# Patient Record
Sex: Male | Born: 1995 | Race: White | Hispanic: No | Marital: Single | Smoking: Former smoker
Health system: Southern US, Community
[De-identification: ages and names within clinical notes are randomized; demographics above are authoritative.]

## PROBLEM LIST (undated history)

## (undated) DIAGNOSIS — F419 Anxiety disorder, unspecified: Secondary | ICD-10-CM

---
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
## 2019-11-15 ENCOUNTER — Other Ambulatory Visit: Payer: Self-pay

## 2019-11-15 ENCOUNTER — Telehealth: Payer: Self-pay | Admitting: Emergency Medicine

## 2019-11-15 ENCOUNTER — Emergency Department (INDEPENDENT_AMBULATORY_CARE_PROVIDER_SITE_OTHER)
Admission: EM | Admit: 2019-11-15 | Discharge: 2019-11-15 | Disposition: A | Discharge status: Home / Self Care | Payer: Self-pay | Source: Home / Self Care

## 2019-11-15 ENCOUNTER — Emergency Department
Admission: EM | Admit: 2019-11-15 | Discharge: 2019-11-15 | Disposition: A | Discharge status: Home / Self Care | Payer: Self-pay | Source: Home / Self Care

## 2019-11-15 DIAGNOSIS — Z87442 Personal history of urinary calculi: Secondary | ICD-10-CM

## 2019-11-15 DIAGNOSIS — N342 Other urethritis: Secondary | ICD-10-CM

## 2019-11-15 DIAGNOSIS — R369 Urethral discharge, unspecified: Secondary | ICD-10-CM

## 2019-11-15 DIAGNOSIS — R300 Dysuria: Secondary | ICD-10-CM

## 2019-11-15 HISTORY — DX: Anxiety disorder, unspecified: F41.9

## 2019-11-15 LAB — POCT URINALYSIS DIP (MANUAL ENTRY)
Bilirubin, UA: NEGATIVE
Glucose, UA: NEGATIVE mg/dL
Ketones, POC UA: NEGATIVE mg/dL
Nitrite, UA: NEGATIVE
Spec Grav, UA: 1.02 (ref 1.010–1.025)
Urobilinogen, UA: 0.2 E.U./dL
pH, UA: 6 (ref 5.0–8.0)

## 2019-11-15 MED ORDER — CEFTRIAXONE SODIUM 500 MG IJ SOLR
500.0000 mg | Freq: Once | INTRAMUSCULAR | Status: AC
  Administered 2019-11-15: 500 mg via INTRAMUSCULAR

## 2019-11-15 MED ORDER — DOXYCYCLINE HYCLATE 100 MG PO CAPS: 100.0000 mg | ORAL_CAPSULE | Freq: Two times a day (BID) | ORAL | 0 refills | Status: AC

## 2019-11-15 NOTE — ED Notes (Signed)
Before leaving the building from first visit, patient returned complaining of scrotal pain. Evaluated by provider, questions asked and answers provided. Patient discharged.

## 2019-11-15 NOTE — Telephone Encounter (Signed)
Pt had mild episode of lightheadedness after rocephin injection, pt states he is feeling well now. No rashes. No oral swelling or trouble breathing. Still denies scrotal pain or swelling. Will f/u with urology or go to hospital if symptoms worsen.

## 2019-11-15 NOTE — ED Provider Notes (Addendum)
[facility]    CSN: [id number] Arrival date & time: 11/15/19  9563      History   Chief Complaint Chief Complaint  Patient presents with  . Dysuria    HPI Thomas Bush is a 24 y.o. male.   HPI  Thomas Bush is a 24 y.o. male presenting to UC with c/o dysuria, penile discharge and "prostate pain" that started this morning. Recent hx of multiple kidney stones on Right side, seen in ED for the stones 2 weeks ago for abdominal pain and hematuria.  Largest was 7mm.  He was prescribed vicodin and flomax. He has f/u virtually with urology and had been doing well until this morning. He has had unprotected intercourse but no known exposure to an STI. Denies fever, n/v/d. Denies scrotal pain or swelling. Denies rashes.  Pt triage note from another facility, pt c/o constant scrotal pain and swelling. Pt denies at this time after taking half his Vicodin (5mg /325mg ) tab PTA.  Past Medical History:  Diagnosis Date  . Anxiety     There are no problems to display for this patient.   History reviewed. No pertinent surgical history.     Home Medications    Prior to Admission medications   Medication Sig Start Date End Date Taking? Authorizing Provider  tamsulosin (FLOMAX) 0.4 MG CAPS capsule Take by mouth. 11/08/19  Yes [provider]  doxycycline (VIBRAMYCIN) 100 MG capsule Take 1 capsule (100 mg total) by mouth 2 (two) times daily for 7 days. 11/15/19 11/22/19  11/24/19, PA-C  HYDROcodone-acetaminophen (NORCO/VICODIN) 5-325 MG tablet Take 1 tablet by mouth every 6 (six) hours as needed. 11/09/19   [provider]    Family History Family History  Problem Relation Age of Onset  . Bipolar disorder Mother   . Hernia Father   . Kidney Stones Father     Social History Social History   Tobacco Use  . Smoking status: Former 11/11/19  . Smokeless tobacco: Never Used  Vaping Use  . Vaping Use: Never used  Substance Use Topics  . Alcohol use: Yes     Alcohol/week: 2.0 standard drinks    Types: 2 Shots of liquor per week  . Drug use: Not Currently    Types: Marijuana     Allergies   Fd&c red #40 [red dye]   Review of Systems Review of Systems  Constitutional: Negative for chills and fever.  Gastrointestinal: Negative for abdominal pain, diarrhea, nausea and vomiting.  Genitourinary: Positive for decreased urine volume, discharge, dysuria, flank pain (right), hematuria and penile pain. Negative for penile swelling, scrotal swelling, testicular pain and urgency.     Physical Exam Triage Vital Signs ED Triage Vitals  Enc Vitals Group     BP --      Pulse --      Resp --      Temp --      Temp src --      SpO2 --      Weight 11/15/19 0942 174 lb (78.9 kg)     Height 11/15/19 0942 5\' 9"  (1.753 m)     Head Circumference --      Peak Flow --      Pain Score 11/15/19 0940 2     Pain Loc --      Pain Edu? --      Excl. in GC? --    No data found.  Updated Vital Signs BP 126/77 (BP Location: Right  Arm)   Temp 98 F (36.7 C) (Oral)   Resp 18   Ht 5\' 9"  (1.753 m)   Wt 174 lb (78.9 kg)   SpO2 98%   BMI 25.70 kg/m   Visual Acuity Right Eye Distance:   Left Eye Distance:   Bilateral Distance:    Right Eye Near:   Left Eye Near:    Bilateral Near:     Physical Exam Vitals and nursing note reviewed. Exam conducted with a chaperone present.  Constitutional:      Appearance: Normal appearance. He is well-developed.  HENT:     Head: Normocephalic and atraumatic.  Cardiovascular:     Rate and Rhythm: Normal rate and regular rhythm.  Pulmonary:     Effort: Pulmonary effort is normal. No respiratory distress.     Breath sounds: Normal breath sounds.  Abdominal:     General: There is no distension.     Palpations: Abdomen is soft.     Tenderness: There is no abdominal tenderness. There is right CVA tenderness. There is no left CVA tenderness.     Hernia: There is no hernia in the left inguinal area or right  inguinal area.  Genitourinary:    Penis: Normal. No erythema, tenderness, discharge, swelling or lesions.      Testes: Normal.        Right: Mass, tenderness or swelling not present.        Left: Mass, tenderness or swelling not present.     Epididymis:     Right: Normal. Not inflamed or enlarged. No mass or tenderness.     Left: Normal. Not inflamed or enlarged. No mass or tenderness.  Musculoskeletal:        General: Normal range of motion.     Cervical back: Normal range of motion.  Lymphadenopathy:     Lower Body: No right inguinal adenopathy. No left inguinal adenopathy.  Skin:    General: Skin is warm and dry.  Neurological:     Mental Status: He is alert and oriented to person, place, and time.  Psychiatric:        Behavior: Behavior normal.      UC Treatments / Results  Labs (all labs ordered are listed, but only abnormal results are displayed) Labs Reviewed  POCT URINALYSIS DIP (MANUAL ENTRY) - Abnormal; Notable for the following components:      Result Value   Color, UA other (*)    Clarity, UA cloudy (*)    Blood, UA large (*)    Protein Ur, POC trace (*)    Leukocytes, UA Trace (*)    All other components within normal limits  C. TRACHOMATIS/N. GONORRHOEAE RNA    EKG   Radiology No results found.  Procedures Procedures (including critical care time)  Medications Ordered in UC Medications  cefTRIAXone (ROCEPHIN) injection 500 mg (500 mg Intramuscular Given 11/15/19 1017)    Initial Impression / Assessment and Plan / UC Course  I have reviewed the triage vital signs and the nursing notes.  Pertinent labs & imaging results that were available during my care of the patient were reviewed by me and considered in my medical decision making (see chart for details).     Discussed UA with pt GU exam normal. No evidence of testicular torsion or epididymitis at this time.  Pt would like to start empiric treatment for STIs Pain could be from passing of  stones discovered last week F/u with PCP or urology  AVS given  Final Clinical Impressions(s) / UC Diagnoses   Final diagnoses:  Penile discharge  Dysuria  History of kidney stones  Urethritis   Discharge Instructions   None    ED Prescriptions    Medication Sig Dispense Auth. Provider   doxycycline (VIBRAMYCIN) 100 MG capsule Take 1 capsule (100 mg total) by mouth 2 (two) times daily for 7 days. 14 capsule Lurene Shadow, [place]     PDMP not reviewed this encounter.   Lurene Shadow, PA-C 11/15/19 1124    [address] [place] 11/15/19 1143

## 2019-11-15 NOTE — ED Triage Notes (Signed)
Pt presents for dysuria, pain upon urination, suspects uti. Pt was hospitalized 2 weeks ago for hematuria and abdominal diagnosed w/ kidney stones 28mm in tract and more stones within R kidney that would be likely to pass as well. He also complains of pain in R testicle, burning and pain on tip of penis and prostate pain as wells. Pt also reports having unprotected sex. Pt has prescription pain meds at home prn hydrocodone.

## 2019-11-16 ENCOUNTER — Telehealth: Payer: Self-pay

## 2019-11-16 LAB — C. TRACHOMATIS/N. GONORRHOEAE RNA
C. trachomatis RNA, TMA: DETECTED — AB
N. gonorrhoeae RNA, TMA: NOT DETECTED

## 2019-11-16 NOTE — Telephone Encounter (Signed)
Pt called for lab results. Advised of pos std testing. Was treated in house. Informed to tell any sexual partners of dx and for them to test. Avoid sexual intercourse until meds have completed.

## 2020-08-25 DEATH — deceased
